# Patient Record
Sex: Male | Born: 1985 | Race: White | Hispanic: No | Marital: Single
Health system: Southern US, Community
[De-identification: ages and names within clinical notes are randomized; demographics above are authoritative.]

## PROBLEM LIST (undated history)

## (undated) HISTORY — PX: CHOLECYSTECTOMY: SHX55

## (undated) HISTORY — PX: BACK SURGERY: SHX140

## (undated) HISTORY — PX: OTHER SURGICAL HISTORY: SHX169

---

## 2018-08-24 ENCOUNTER — Encounter (HOSPITAL_COMMUNITY): Payer: Self-pay

## 2018-08-24 ENCOUNTER — Other Ambulatory Visit: Payer: Self-pay

## 2018-08-24 ENCOUNTER — Emergency Department (HOSPITAL_COMMUNITY): Payer: 59

## 2018-08-24 ENCOUNTER — Emergency Department (HOSPITAL_COMMUNITY)
Admission: EM | Admit: 2018-08-24 | Discharge: 2018-08-24 | Disposition: A | Discharge status: Home / Self Care | Payer: 59 | Attending: Emergency Medicine | Admitting: Emergency Medicine

## 2018-08-24 DIAGNOSIS — W010XXA Fall on same level from slipping, tripping and stumbling without subsequent striking against object, initial encounter: Secondary | ICD-10-CM | POA: Diagnosis not present

## 2018-08-24 DIAGNOSIS — Y999 Unspecified external cause status: Secondary | ICD-10-CM | POA: Diagnosis not present

## 2018-08-24 DIAGNOSIS — S20212A Contusion of left front wall of thorax, initial encounter: Secondary | ICD-10-CM | POA: Diagnosis not present

## 2018-08-24 DIAGNOSIS — Y929 Unspecified place or not applicable: Secondary | ICD-10-CM | POA: Diagnosis not present

## 2018-08-24 DIAGNOSIS — S8012XA Contusion of left lower leg, initial encounter: Secondary | ICD-10-CM | POA: Diagnosis not present

## 2018-08-24 DIAGNOSIS — S299XXA Unspecified injury of thorax, initial encounter: Secondary | ICD-10-CM | POA: Diagnosis present

## 2018-08-24 DIAGNOSIS — Y939 Activity, unspecified: Secondary | ICD-10-CM | POA: Insufficient documentation

## 2018-08-24 MED ORDER — OXYCODONE-ACETAMINOPHEN 5-325 MG PO TABS
1.0000 | ORAL_TABLET | ORAL | Status: DC | PRN
  Administered 2018-08-24: 1 via ORAL
  Filled 2018-08-24: qty 1

## 2018-08-24 NOTE — ED Provider Notes (Signed)
[facility]   CSN: [id number] Arrival date & time: 08/24/18  2036    History   Chief Complaint Chief Complaint  Patient presents with  . Fall    HPI Joseph Glenn is a 33 y.o. male without significant PMH who presents with left-sided chest wall pain and left leg pain following a fall.  He says that he was descending a steep slope this afternoon when he lost his footing and fell, with his left knee colliding with the left side of his chest.  Since then, he has had significant pain in his left leg and left chest wall that worsens with movement and deep breathing.  He is able to bear weight fully on the left leg.  He notes that he has some swelling of the left leg as well as some ecchymosis but does not have any changes to the left chest wall.  He has not been short of breath.      History reviewed. No pertinent past medical history.  There are no active problems to display for this patient.     Home Medications    Prior to Admission medications   Not on File    Family History No family history on file.  Social History Social History   Tobacco Use  . Smoking status: Not on file  Substance Use Topics  . Alcohol use: Not on file  . Drug use: Not on file     Allergies   Patient has no allergy information on record.   Review of Systems Review of Systems  Constitutional: Negative for activity change and appetite change.  HENT: Negative for congestion.   Respiratory: Negative for cough and shortness of breath.   Cardiovascular: Positive for chest pain (Chest wall pain).  Gastrointestinal: Negative for abdominal pain.  Genitourinary: Negative for dysuria.  Musculoskeletal: Negative for back pain.  Skin: Negative for wound.  Neurological: Negative for headaches.  Psychiatric/Behavioral: The patient is not nervous/anxious.      Physical Exam Updated Vital Signs BP (!) 149/94 (BP Location: Right Arm)   Pulse 75    Temp 98.4 F (36.9 C)   Resp 18   SpO2 98%   Physical Exam Constitutional:      General: He is not in acute distress.    Appearance: Normal appearance. He is obese. He is ill-appearing.  HENT:     Head: Normocephalic and atraumatic.     Right Ear: External ear normal.     Left Ear: External ear normal.     Nose: Nose normal.     Mouth/Throat:     Mouth: Mucous membranes are moist.     Pharynx: Oropharynx is clear.  Eyes:     Extraocular Movements: Extraocular movements intact.     Conjunctiva/sclera: Conjunctivae normal.  Neck:     Musculoskeletal: Normal range of motion.  Cardiovascular:     Rate and Rhythm: Normal rate and regular rhythm.     Pulses: Normal pulses.  Pulmonary:     Effort: No respiratory distress.     Breath sounds: Normal breath sounds. No wheezing.  Chest:     Chest wall: Tenderness present.  Abdominal:     General: Abdomen is flat.     Palpations: Abdomen is soft.  Musculoskeletal: Normal range of motion.        General: Swelling present.  Skin:    General: Skin is warm and dry.  Neurological:     General: No focal deficit  present.     Mental Status: He is alert and oriented to person, place, and time.  Psychiatric:        Mood and Affect: Mood normal.      ED Treatments / Results  Labs (all labs ordered are listed, but only abnormal results are displayed) Labs Reviewed - No data to display  EKG None  Radiology Dg Chest 2 View  Result Date: 08/24/2018 CLINICAL DATA:  Recent fall with left chest pain, initial encounter EXAM: CHEST - 2 VIEW COMPARISON:  None. FINDINGS: The heart size and mediastinal contours are within normal limits. Both lungs are clear. The visualized skeletal structures are unremarkable. IMPRESSION: No active cardiopulmonary disease. Electronically Signed   By: Inez Catalina M.D.   On: 08/24/2018 21:38   Dg Tibia/fibula Left  Result Date: 08/24/2018 CLINICAL DATA:  Recent fall with left knee pain EXAM: LEFT TIBIA AND  FIBULA - 2 VIEW COMPARISON:  None. FINDINGS: There is no evidence of fracture or other focal bone lesions. Soft tissues are unremarkable. IMPRESSION: No acute abnormality noted. Electronically Signed   By: Inez Catalina M.D.   On: 08/24/2018 21:37    Procedures Procedures (including critical care time)  Medications Ordered in ED Medications  oxyCODONE-acetaminophen (PERCOCET/ROXICET) 5-325 MG per tablet 1 tablet (1 tablet Oral Given 08/24/18 2113)     Initial Impression / Assessment and Plan / ED Course  I have reviewed the triage vital signs and the nursing notes.  Pertinent labs & imaging results that were available during my care of the patient were reviewed by me and considered in my medical decision making (see chart for details).       CXR and x-ray of left tibia/fibula are unremarkable for bony abnormality.  Although it is possible for x-rays to miss occult fractures, the management likely would not change if these were present.  Patient was counseled on applying ice frequently and using Tylenol and ibuprofen for pain relief.  He was also encouraged to continue to stay as active as he can tolerate so that he will continue taking normal breaths and avoid atelectasis and pneumonia.  Patient's elevated blood pressure is likely a product of his pain.  Final Clinical Impressions(s) / ED Diagnoses   Final diagnoses:  Contusion of left chest wall, initial encounter  Contusion of left tibia    ED Discharge Orders    None       Kathrene Alu, MD 08/24/18 9937    Elnora Morrison, MD 08/25/18 [phone]

## 2018-08-24 NOTE — ED Triage Notes (Signed)
Pt reports falling down a hill at his house, states his knee slammed into his ribcage. Pt c.o severe pain in his ribs, almost knocking the breath out of him. Pt a.o, nad noted. Oxygen saturation 99%

## 2018-08-24 NOTE — Discharge Instructions (Addendum)
You have severely bruised your ribs and your left leg, which will take some time to heal.  Keep applying ice to these areas and take a combination of Tylenol and ibuprofen to ease your pain.  Continue to stay as active as you can tolerate with your pain to reduce your chance of taking shallow breaths and developing pneumonia.  We hope you feel better soon!

## 2018-08-24 NOTE — ED Notes (Signed)
Pt provided ice pack 

## 2018-08-24 NOTE — ED Notes (Signed)
ED resident at bedside.

## 2020-08-24 IMAGING — DX CHEST - 2 VIEW
2 series · 2 of 2 positions shown · non-contrast
Comparison: None.

CLINICAL DATA: Recent fall with left chest pain, initial encounter

EXAM:
CHEST - 2 VIEW

[chest pa]
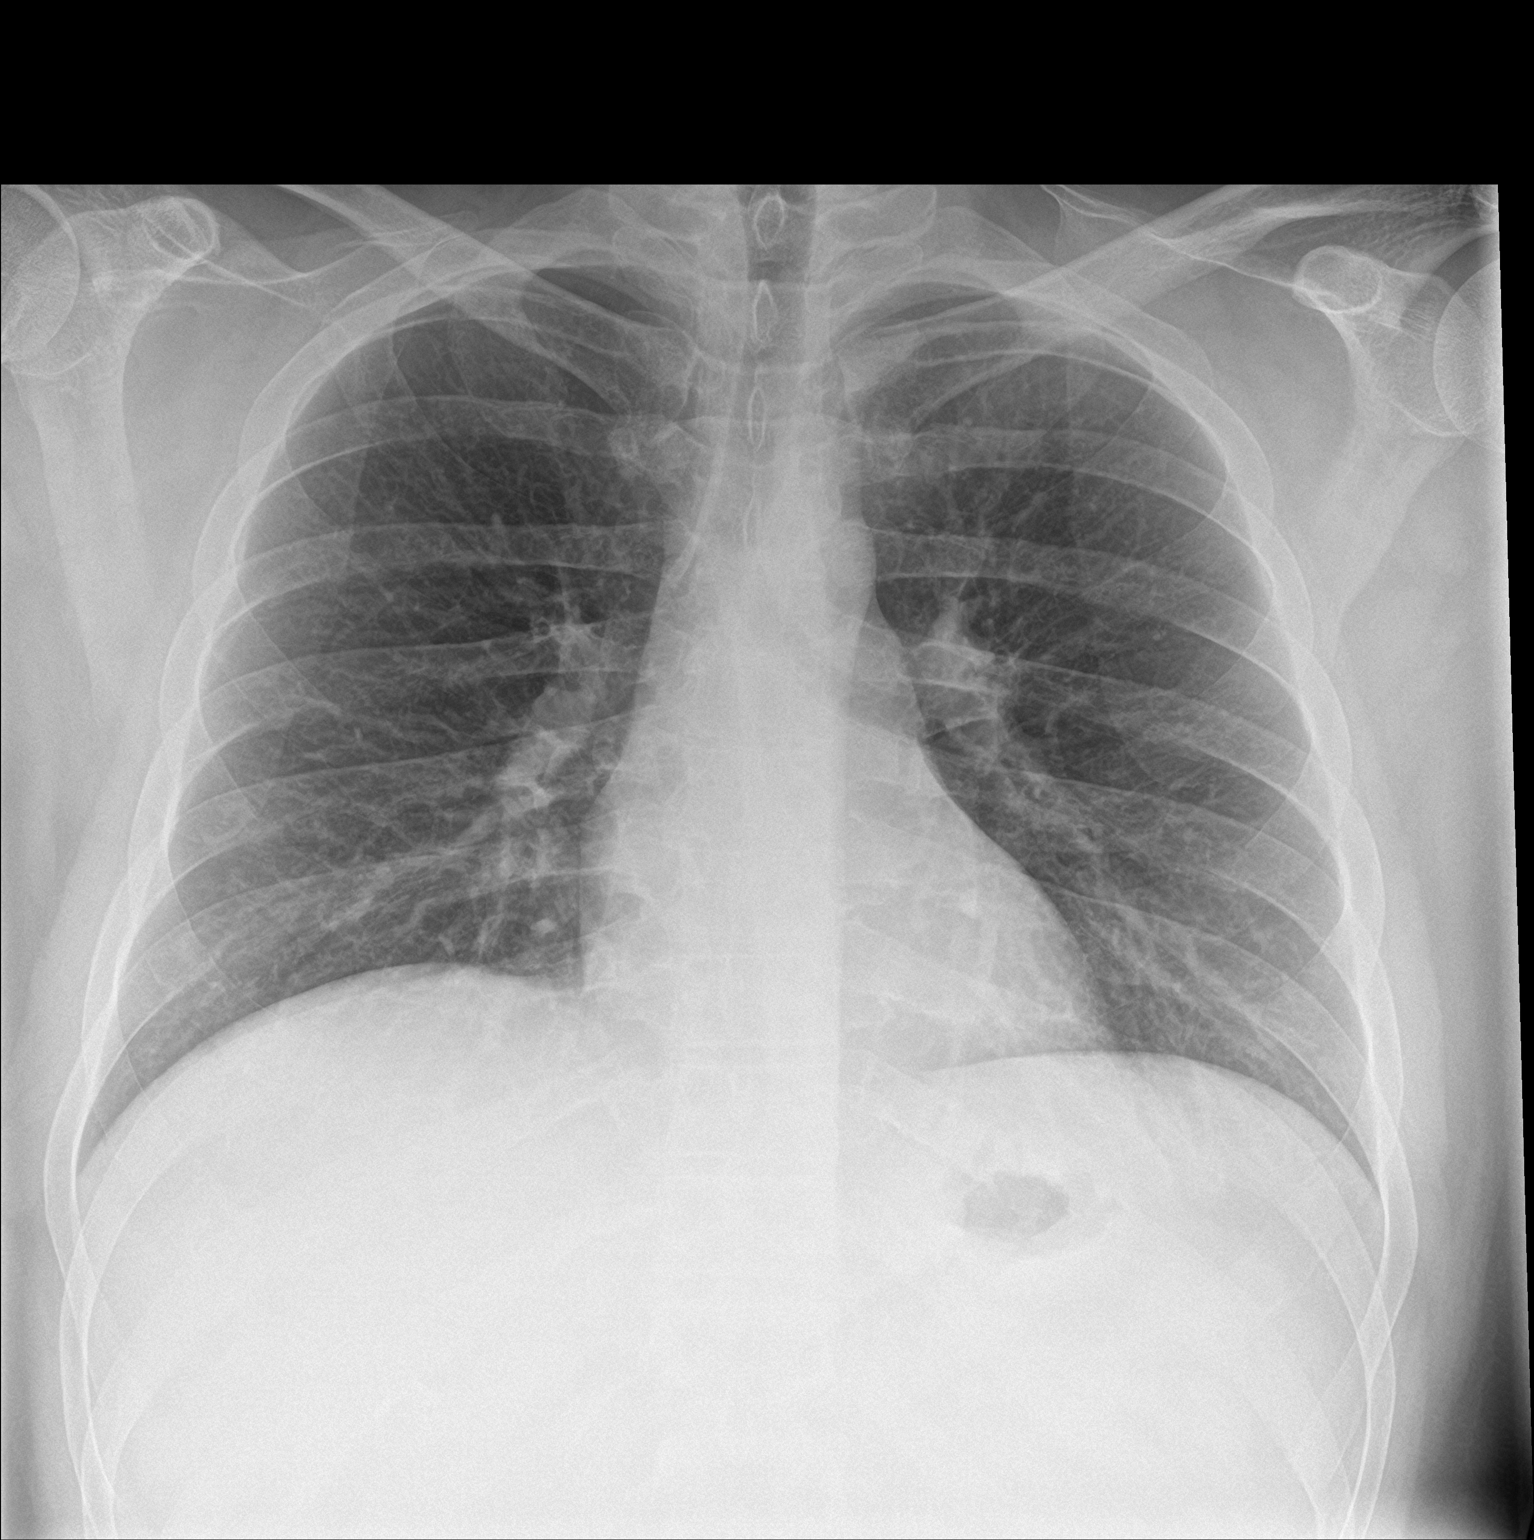

[chest lat]
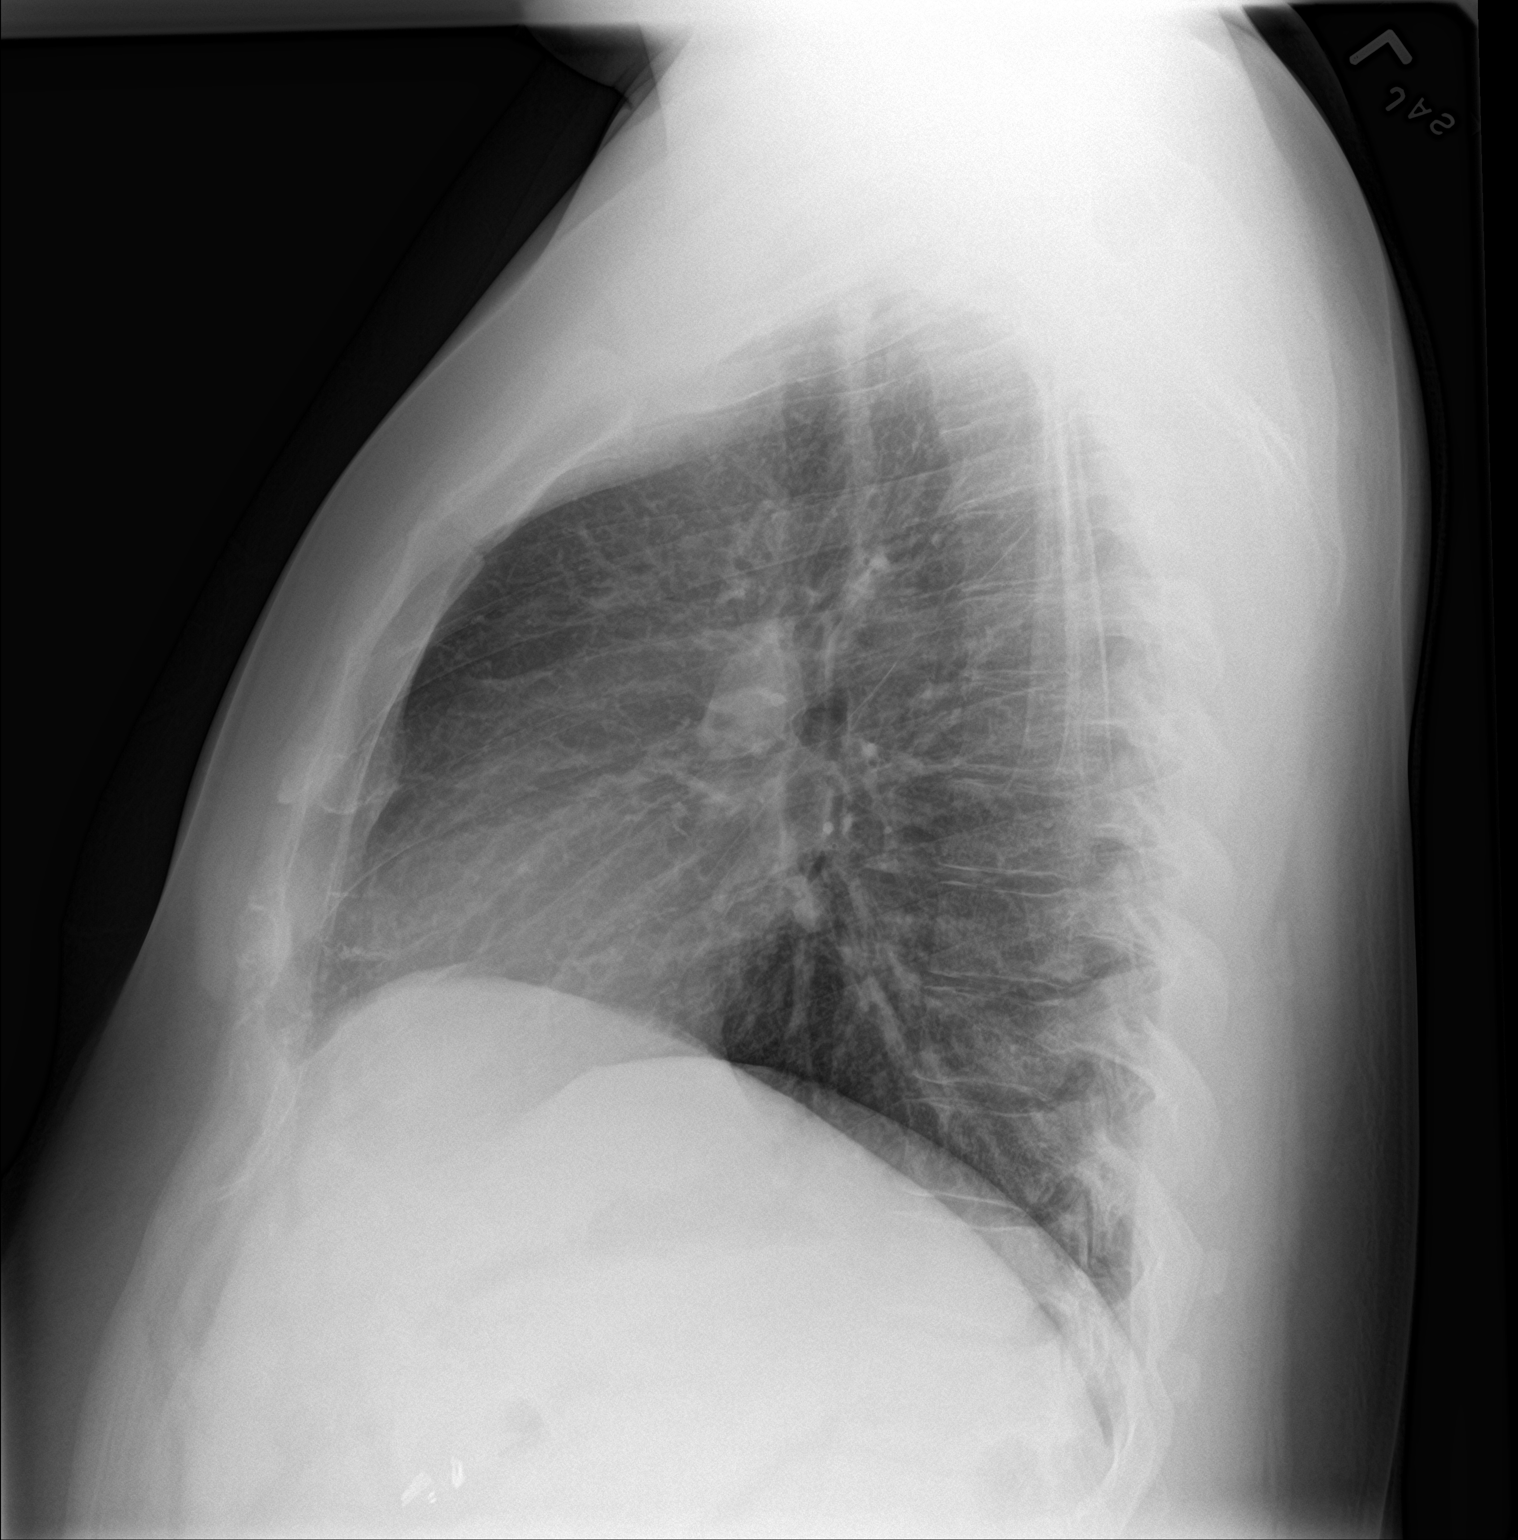

[2 of 2 positions shown; findings below may reference images not displayed]

FINDINGS: The heart size and mediastinal contours are within normal limits.
Both lungs are clear. The visualized skeletal structures are
unremarkable.
IMPRESSION: No active cardiopulmonary disease.

## 2020-08-24 IMAGING — DX LEFT TIBIA AND FIBULA - 2 VIEW
4 series · 4 of 4 positions shown · non-contrast
Comparison: None.

CLINICAL DATA: Recent fall with left knee pain

EXAM:
LEFT TIBIA AND FIBULA - 2 VIEW

[tibia ap (1 of 2)]
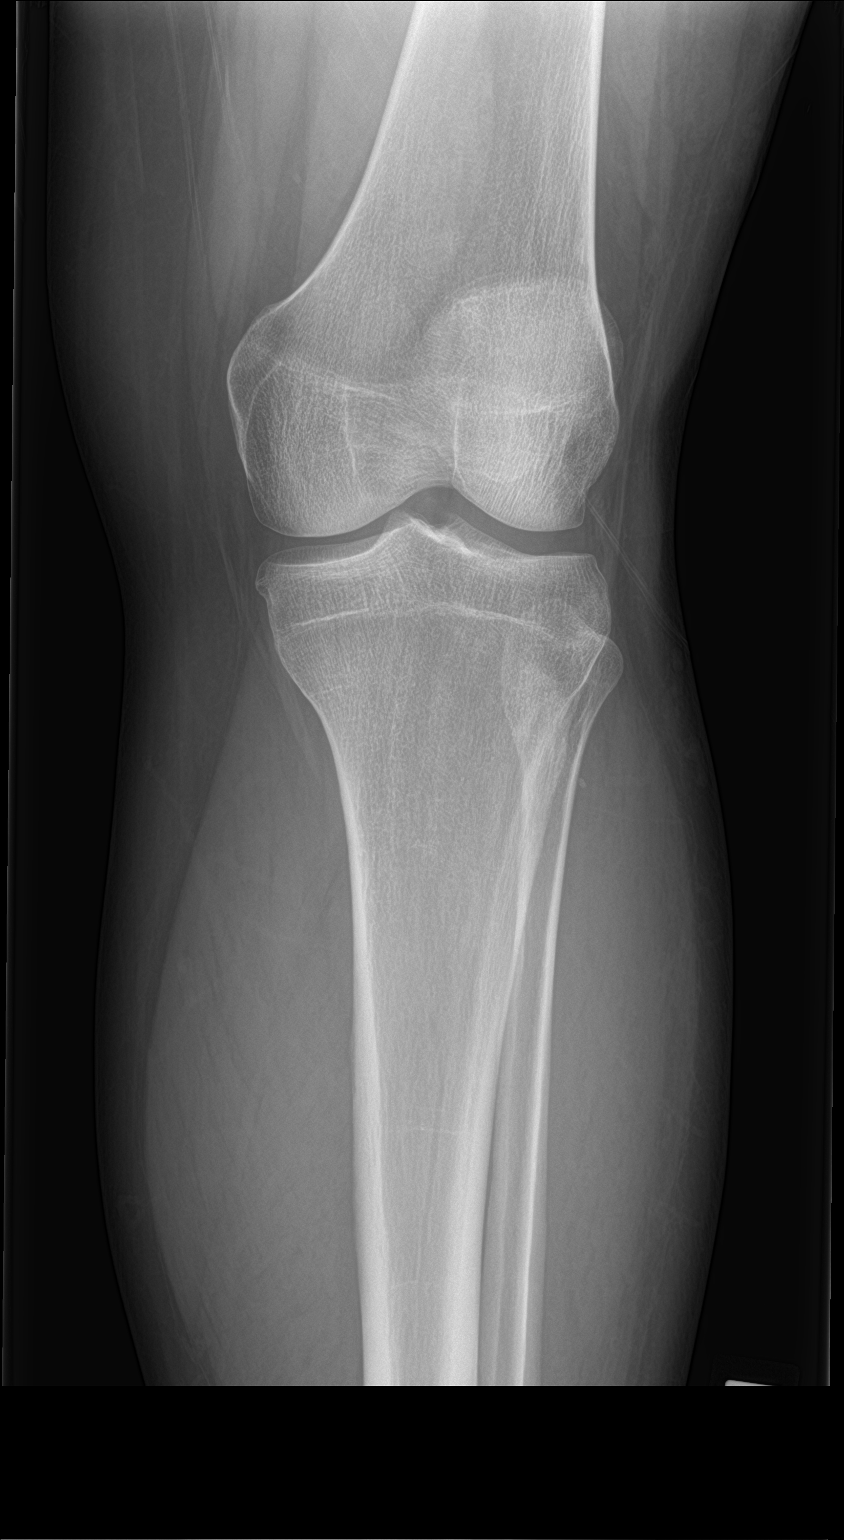

[tibia ap (2 of 2)]
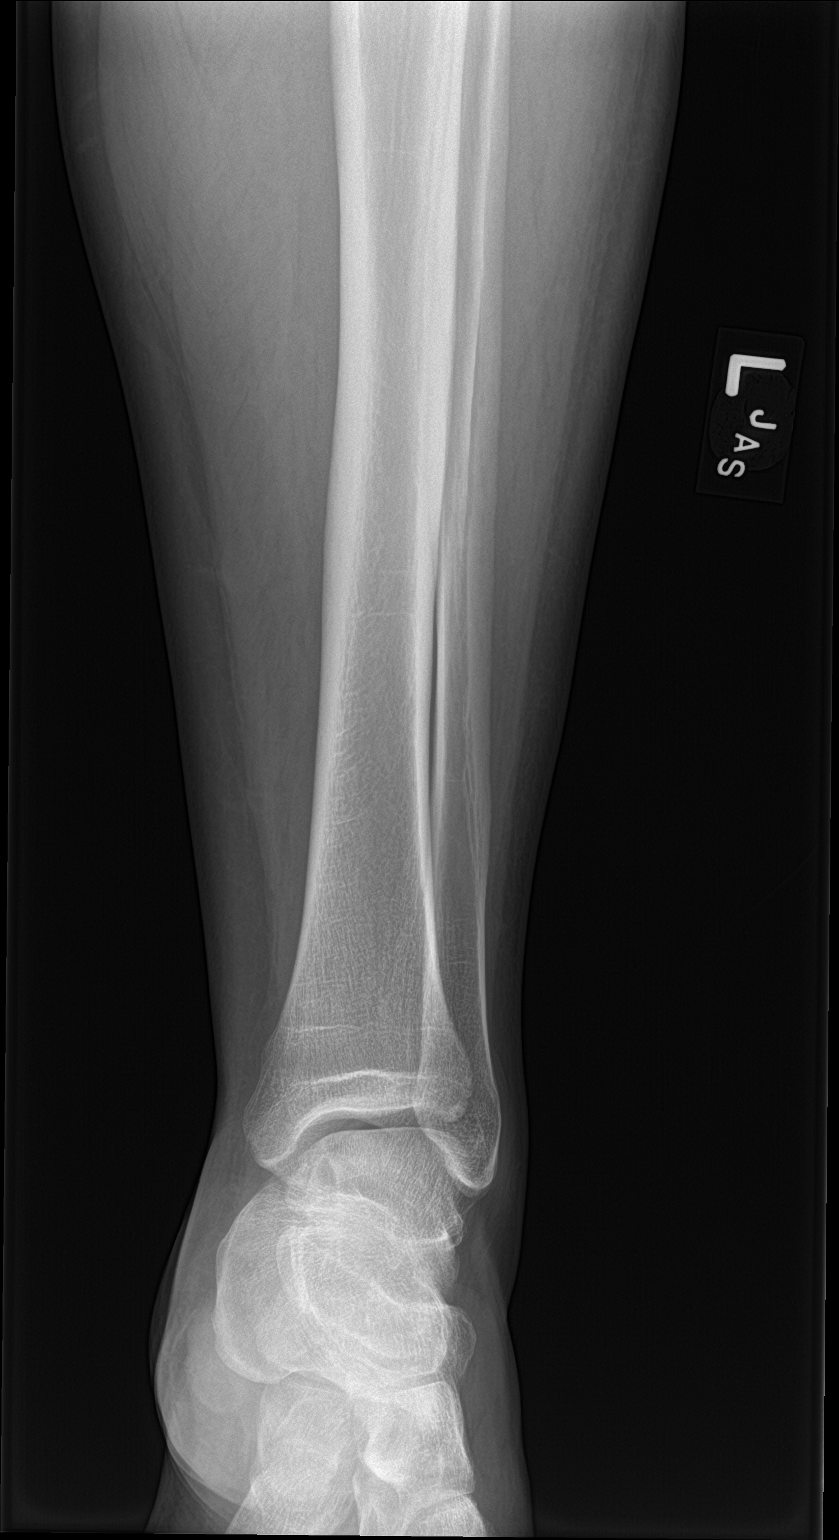

[tibia lat (1 of 2)]
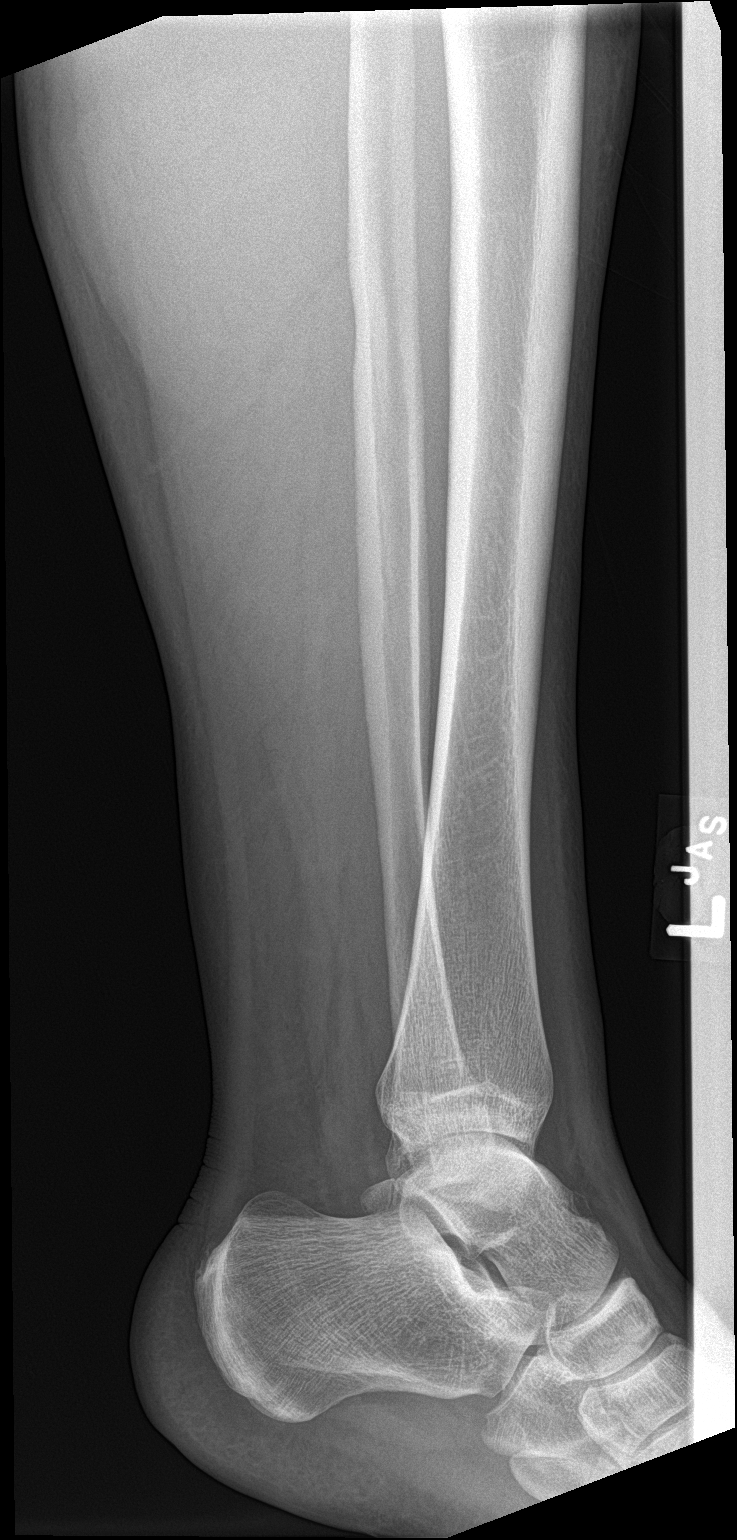

[tibia lat (2 of 2)]
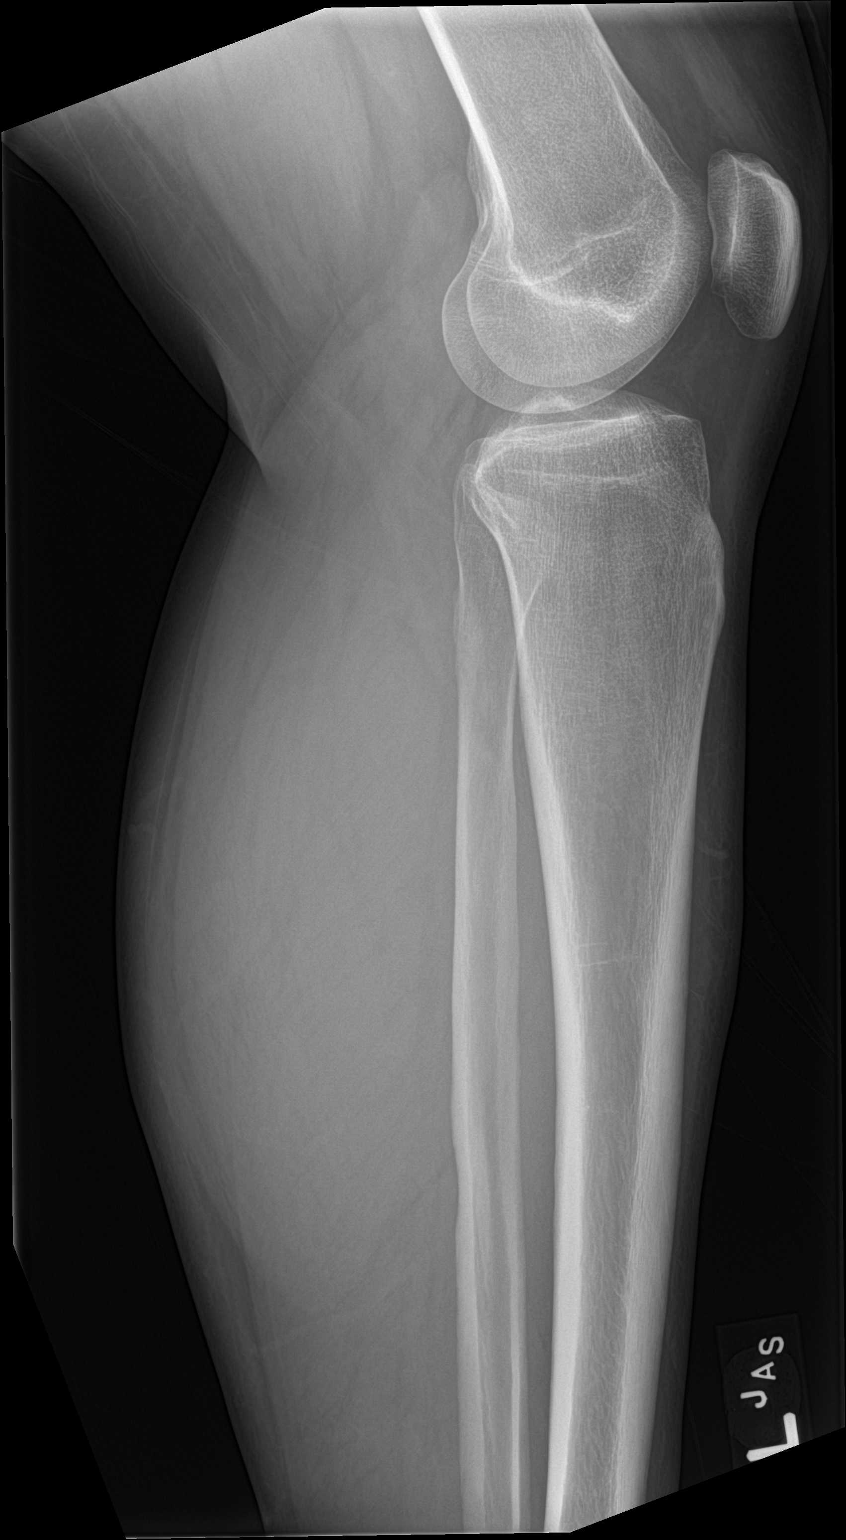

[4 of 4 positions shown; findings below may reference images not displayed]

FINDINGS: There is no evidence of fracture or other focal bone lesions. Soft
tissues are unremarkable.
IMPRESSION: No acute abnormality noted.
